# Patient Record
Sex: Female | Born: 1955 | Race: White | Hispanic: No | Marital: Married | Smoking: Never smoker
Health system: Southern US, Community
[De-identification: ages and names within clinical notes are randomized; demographics above are authoritative.]

## PROBLEM LIST (undated history)

## (undated) HISTORY — PX: TONSILLECTOMY: SUR1361

---

## 2000-08-14 ENCOUNTER — Emergency Department (HOSPITAL_COMMUNITY): Admission: EM | Admit: 2000-08-14 | Discharge: 2000-08-14 | Payer: Self-pay | Admitting: Emergency Medicine

## 2000-08-14 ENCOUNTER — Encounter: Payer: Self-pay | Admitting: Emergency Medicine

## 2014-03-21 ENCOUNTER — Emergency Department (HOSPITAL_COMMUNITY)
Admission: EM | Admit: 2014-03-21 | Discharge: 2014-03-21 | Disposition: A | Discharge status: Home / Self Care | Payer: Self-pay | Attending: Emergency Medicine | Admitting: Emergency Medicine

## 2014-03-21 ENCOUNTER — Encounter (HOSPITAL_COMMUNITY): Payer: Self-pay | Admitting: *Deleted

## 2014-03-21 ENCOUNTER — Emergency Department (HOSPITAL_COMMUNITY): Payer: Self-pay

## 2014-03-21 DIAGNOSIS — R630 Anorexia: Secondary | ICD-10-CM | POA: Insufficient documentation

## 2014-03-21 DIAGNOSIS — Z880 Allergy status to penicillin: Secondary | ICD-10-CM | POA: Insufficient documentation

## 2014-03-21 DIAGNOSIS — R1011 Right upper quadrant pain: Secondary | ICD-10-CM | POA: Insufficient documentation

## 2014-03-21 DIAGNOSIS — R109 Unspecified abdominal pain: Secondary | ICD-10-CM

## 2014-03-21 DIAGNOSIS — R1012 Left upper quadrant pain: Secondary | ICD-10-CM | POA: Insufficient documentation

## 2014-03-21 LAB — COMPREHENSIVE METABOLIC PANEL
ALT: 31 U/L (ref 0–35)
AST: 20 U/L (ref 0–37)
Albumin: 4.3 g/dL (ref 3.5–5.2)
Alkaline Phosphatase: 177 U/L — ABNORMAL HIGH (ref 39–117)
Anion gap: 17 — ABNORMAL HIGH (ref 5–15)
BUN: 12 mg/dL (ref 6–23)
CO2: 24 mEq/L (ref 19–32)
Calcium: 10.2 mg/dL (ref 8.4–10.5)
Chloride: 100 mEq/L (ref 96–112)
Creatinine, Ser: 0.8 mg/dL (ref 0.50–1.10)
GFR calc Af Amer: >90 mL/min (ref >90)
GFR calc non Af Amer: 80 mL/min — ABNORMAL LOW (ref >90)
Glucose, Bld: 102 mg/dL — ABNORMAL HIGH (ref 70–99)
Potassium: 3.6 mEq/L — ABNORMAL LOW (ref 3.7–5.3)
Sodium: 141 mEq/L (ref 137–147)
Total Bilirubin: 0.3 mg/dL (ref 0.3–1.2)
Total Protein: 7.5 g/dL (ref 6.0–8.3)

## 2014-03-21 LAB — CBC WITH DIFFERENTIAL/PLATELET
Basophils Absolute: 0 10*3/uL (ref 0.0–0.1)
Basophils Relative: 0 % (ref 0–1)
Eosinophils Absolute: 0 10*3/uL (ref 0.0–0.7)
Eosinophils Relative: 0 % (ref 0–5)
HCT: 46.3 % — ABNORMAL HIGH (ref 36.0–46.0)
Hemoglobin: 15.6 g/dL — ABNORMAL HIGH (ref 12.0–15.0)
Lymphocytes Relative: 26 % (ref 12–46)
Lymphs Abs: 2 10*3/uL (ref 0.7–4.0)
MCH: 30.1 pg (ref 26.0–34.0)
MCHC: 33.7 g/dL (ref 30.0–36.0)
MCV: 89.4 fL (ref 78.0–100.0)
Monocytes Absolute: 0.6 10*3/uL (ref 0.1–1.0)
Monocytes Relative: 7 % (ref 3–12)
Neutro Abs: 5.2 10*3/uL (ref 1.7–7.7)
Neutrophils Relative %: 67 % (ref 43–77)
Platelets: 255 10*3/uL (ref 150–400)
RBC: 5.18 MIL/uL — ABNORMAL HIGH (ref 3.87–5.11)
RDW: 13.7 % (ref 11.5–15.5)
WBC: 7.8 10*3/uL (ref 4.0–10.5)

## 2014-03-21 LAB — URINE MICROSCOPIC-ADD ON

## 2014-03-21 LAB — URINALYSIS, ROUTINE W REFLEX MICROSCOPIC
BILIRUBIN URINE: NEGATIVE
Glucose, UA: NEGATIVE mg/dL
Ketones, ur: NEGATIVE mg/dL
Leukocytes, UA: NEGATIVE
Nitrite: NEGATIVE
PROTEIN: NEGATIVE mg/dL
Specific Gravity, Urine: 1.016 (ref 1.005–1.030)
Urobilinogen, UA: 0.2 mg/dL (ref 0.0–1.0)
pH: 6 (ref 5.0–8.0)

## 2014-03-21 LAB — LIPASE, BLOOD: Lipase: 28 U/L (ref 11–59)

## 2014-03-21 MED ORDER — IOHEXOL 300 MG/ML  SOLN
50.0000 mL | Freq: Once | INTRAMUSCULAR | Status: AC | PRN
  Administered 2014-03-21: 50 mL via ORAL

## 2014-03-21 MED ORDER — HYDROCODONE-ACETAMINOPHEN 5-325 MG PO TABS: 1.0000 | ORAL_TABLET | Freq: Four times a day (QID) | ORAL | Status: DC | PRN

## 2014-03-21 MED ORDER — IOHEXOL 300 MG/ML  SOLN
100.0000 mL | Freq: Once | INTRAMUSCULAR | Status: AC | PRN
  Administered 2014-03-21: 100 mL via INTRAVENOUS

## 2014-03-21 NOTE — Discharge Instructions (Signed)
Abdominal Pain, Women °Abdominal (stomach, pelvic, or belly) pain can be caused by many things. It is important to tell your doctor: °· The location of the pain. °· Does it come and go or is it present all the time? °· Are there things that start the pain (eating certain foods, exercise)? °· Are there other symptoms associated with the pain (fever, nausea, vomiting, diarrhea)? °All of this is helpful to know when trying to find the cause of the pain. °CAUSES  °· Stomach: virus or bacteria infection, or ulcer. °· Intestine: appendicitis (inflamed appendix), regional ileitis (Crohn's disease), ulcerative colitis (inflamed colon), irritable bowel syndrome, diverticulitis (inflamed diverticulum of the colon), or cancer of the stomach or intestine. °· Gallbladder disease or stones in the gallbladder. °· Kidney disease, kidney stones, or infection. °· Pancreas infection or cancer. °· Fibromyalgia (pain disorder). °· Diseases of the female organs: °¨ Uterus: fibroid (non-cancerous) tumors or infection. °¨ Fallopian tubes: infection or tubal pregnancy. °¨ Ovary: cysts or tumors. °¨ Pelvic adhesions (scar tissue). °¨ Endometriosis (uterus lining tissue growing in the pelvis and on the pelvic organs). °¨ Pelvic congestion syndrome (female organs filling up with blood just before the menstrual period). °¨ Pain with the menstrual period. °¨ Pain with ovulation (producing an egg). °¨ Pain with an IUD (intrauterine device, birth control) in the uterus. °¨ Cancer of the female organs. °· Functional pain (pain not caused by a disease, may improve without treatment). °· Psychological pain. °· Depression. °DIAGNOSIS  °Your doctor will decide the seriousness of your pain by doing an examination. °· Blood tests. °· X-rays. °· Ultrasound. °· CT scan (computed tomography, special type of X-ray). °· MRI (magnetic resonance imaging). °· Cultures, for infection. °· Barium enema (dye inserted in the large intestine, to better view it with  X-rays). °· Colonoscopy (looking in intestine with a lighted tube). °· Laparoscopy (minor surgery, looking in abdomen with a lighted tube). °· Major abdominal exploratory surgery (looking in abdomen with a large incision). °TREATMENT  °The treatment will depend on the cause of the pain.  °· Many cases can be observed and treated at home. °· Over-the-counter medicines recommended by your caregiver. °· Prescription medicine. °· Antibiotics, for infection. °· Birth control pills, for painful periods or for ovulation pain. °· Hormone treatment, for endometriosis. °· Nerve blocking injections. °· Physical therapy. °· Antidepressants. °· Counseling with a psychologist or psychiatrist. °· Minor or major surgery. °HOME CARE INSTRUCTIONS  °· Do not take laxatives, unless directed by your caregiver. °· Take over-the-counter pain medicine only if ordered by your caregiver. Do not take aspirin because it can cause an upset stomach or bleeding. °· Try a clear liquid diet (broth or water) as ordered by your caregiver. Slowly move to a bland diet, as tolerated, if the pain is related to the stomach or intestine. °· Have a thermometer and take your temperature several times a day, and record it. °· Bed rest and sleep, if it helps the pain. °· Avoid sexual intercourse, if it causes pain. °· Avoid stressful situations. °· Keep your follow-up appointments and tests, as your caregiver orders. °· If the pain does not go away with medicine or surgery, you may try: °¨ Acupuncture. °¨ Relaxation exercises (yoga, meditation). °¨ Group therapy. °¨ Counseling. °SEEK MEDICAL CARE IF:  °· You notice certain foods cause stomach pain. °· Your home care treatment is not helping your pain. °· You need stronger pain medicine. °· You want your IUD removed. °· You feel faint or   lightheaded. °· You develop nausea and vomiting. °· You develop a rash. °· You are having side effects or an allergy to your medicine. °SEEK IMMEDIATE MEDICAL CARE IF:  °· Your  pain does not go away or gets worse. °· You have a fever. °· Your pain is felt only in portions of the abdomen. The right side could possibly be appendicitis. The left lower portion of the abdomen could be colitis or diverticulitis. °· You are passing blood in your stools (bright red or black tarry stools, with or without vomiting). °· You have blood in your urine. °· You develop chills, with or without a fever. °· You pass out. °MAKE SURE YOU:  °· Understand these instructions. °· Will watch your condition. °· Will get help right away if you are not doing well or get worse. °Document Released: 01/13/2007 Document Revised: 08/02/2013 Document Reviewed: 02/02/2009 °[employer]® Patient Information ©2015 [employer]. This information is not intended to replace advice given to you by your health care provider. Make sure you discuss any questions you have with your health care provider. ° °

## 2014-03-21 NOTE — ED Provider Notes (Signed)
CSN: [id number]     Arrival date & time 03/21/14  1041 History   First MD Initiated Contact with Patient 03/21/14 1213     Chief Complaint  Patient presents with  . Abdominal Pain     (Consider location/radiation/quality/duration/timing/severity/associated sxs/prior Treatment) Patient is a 58 y.o. female presenting with abdominal pain.  Abdominal Pain Pain location:  Epigastric, LUQ and RUQ Pain quality: aching   Pain radiates to:  Does not radiate Pain severity:  Moderate Onset quality:  Gradual Duration:  16 weeks Timing:  Constant Progression:  Waxing and waning Context: recent illness (recent PNA)   Context: not previous surgeries   Relieved by:  Nothing Worsened by:  Movement Associated symptoms: anorexia   Associated symptoms: no constipation, no cough, no diarrhea, no dysuria, no fever, no nausea and no vomiting     History reviewed. No pertinent past medical history. Past Surgical History  Procedure Laterality Date  . Tonsillectomy     History reviewed. No pertinent family history. History  Substance Use Topics  . Smoking status: Never Smoker   . Smokeless tobacco: Not on file  . Alcohol Use: No   OB History    No data available     Review of Systems  Constitutional: Negative for fever.  Respiratory: Negative for cough.   Gastrointestinal: Positive for abdominal pain and anorexia. Negative for nausea, vomiting, diarrhea and constipation.  Genitourinary: Negative for dysuria.  All other systems reviewed and are negative.     Allergies  Penicillins  Home Medications   Prior to Admission medications   Medication Sig Start Date End Date Taking? Authorizing Provider  Ranitidine HCl (ZANTAC PO) Take 1 tablet by mouth daily as needed (heartburn).   Yes Historical Provider, MD  HYDROcodone-acetaminophen (NORCO/VICODIN) 5-325 MG per tablet Take 1 tablet by mouth every 6 (six) hours as needed for moderate pain. 03/21/14   Elwin MochaBlair Walden, MD   BP 142/99  mmHg  Pulse 89  Temp(Src) 97.6 F (36.4 C) (Oral)  Resp 16  SpO2 96% Physical Exam  Constitutional: She is oriented to person, place, and time. She appears well-developed and well-nourished.  HENT:  Head: Normocephalic and atraumatic.  Right Ear: External ear normal.  Left Ear: External ear normal.  Eyes: Conjunctivae and EOM are normal. Pupils are equal, round, and reactive to light.  Neck: Normal range of motion. Neck supple.  Cardiovascular: Normal rate, regular rhythm, normal heart sounds and intact distal pulses.   Pulmonary/Chest: Effort normal and breath sounds normal.  Abdominal: Soft. Bowel sounds are normal. There is tenderness in the right upper quadrant, epigastric area and left upper quadrant. There is positive Murphy's sign.  Musculoskeletal: Normal range of motion.  Neurological: She is alert and oriented to person, place, and time.  Skin: Skin is warm and dry.  Vitals reviewed.   ED Course  Procedures (including critical care time) Labs Review Labs Reviewed  COMPREHENSIVE METABOLIC PANEL - Abnormal; Notable for the following:    Potassium 3.6 (*)    Glucose, Bld 102 (*)    Alkaline Phosphatase 177 (*)    GFR calc non Af Amer 80 (*)    Anion gap 17 (*)    All other components within normal limits  CBC WITH DIFFERENTIAL - Abnormal; Notable for the following:    RBC 5.18 (*)    Hemoglobin 15.6 (*)    HCT 46.3 (*)    All other components within normal limits  URINALYSIS, ROUTINE W REFLEX MICROSCOPIC - Abnormal; Notable for  the following:    Hgb urine dipstick TRACE (*)    All other components within normal limits  LIPASE, BLOOD  URINE MICROSCOPIC-ADD ON    Imaging Review Ct Abdomen Pelvis W Contrast  03/21/2014   CLINICAL DATA:  Epigastric pain, RIGHT to LEFT mid abdominal pain off and on for 4 months  EXAM: CT ABDOMEN AND PELVIS WITH CONTRAST  TECHNIQUE: Multidetector CT imaging of the abdomen and pelvis was performed using the standard protocol following  bolus administration of intravenous contrast. Sagittal and coronal MPR images reconstructed from axial data set.  CONTRAST:  100mL OMNIPAQUE IOHEXOL 300 MG/ML SOLN IV. Dilute oral contrast.  COMPARISON:  None ; prior exam 08/14/2000 predates PACs and is no longer available for comparison.  FINDINGS: Linear subsegmental atelectasis BILATERAL lower lobes.  Liver, spleen, pancreas, kidneys, and adrenal glands normal.  Tiny umbilical hernia containing fat.  Normal appendix.  Cecum unopacified and incompletely distended, suboptimally assessed.  Minimally thickened pylorus without outlet obstruction.  Stomach and remaining bowel loops unremarkable.  Normal appearing bladder, ureters, uterus and adnexae.  No mass, adenopathy, free fluid or free air.  No acute osseous findings.  IMPRESSION: No acute intra-abdominal or intrapelvic abnormalities identified.  Tiny umbilical hernia containing fat.   Electronically Signed   By: Ulyses SouthwardMark  Boles M.D.   On: 03/21/2014 16:45   [place] Abdomen Limited  03/21/2014   CLINICAL DATA:  Four month history of right upper quadrant pain  EXAM: US ABDOMEN LIMITED - RIGHT UPPER QUADRANT  COMPARISON:  None.  FINDINGS: Gallbladder:  No gallstones or wall thickening visualized. There is no pericholecystic fluid. No sonographic Murphy sign noted.  Common bile duct:  Diameter: 4 mm. There is no intrahepatic or extrahepatic biliary duct dilatation.  Liver:  No focal lesion identified. The liver echogenicity is diffusely increased.  IMPRESSION: Diffuse increase in liver echogenicity. This finding is felt to be secondary to hepatic steatosis. While no focal liver lesions are identified, it must be cautioned that the sensitivity of ultrasound for focal liver lesions is diminished in this circumstance. Study otherwise unremarkable.   Electronically Signed   By: Bretta BangWilliam  Woodruff M.D.   On: 03/21/2014 14:22     EKG Interpretation None      MDM   Final diagnoses:  RUQ pain  Abdominal pain    58  y.o. female without pertinent PMH presents with abdominal pain as described above. Pain ongoing for 4 months, patient states her symptoms have possibly worsened over the past few days, however cannot give a specific reason or precipitant for her visit today. She denies GI symptoms.  She has tried drinking lemon juice, Castor oil, other home remedies without relief. She states that her pain is not consistently exacerbated by eating. On arrival today vitals signs and physical exam as above. Patient has positive Murphy sign.    US remarkable for hepatic steatosis, no cholecystitis.  CT scan ordered.  Pt care to Dr. Gwendolyn GrantWalden pending CT.  I have reviewed all laboratory and imaging studies if ordered as above  1. RUQ pain   2. Abdominal pain         Mirian MoMatthew Jodi Kappes, MD 03/22/14 [phone]

## 2014-03-21 NOTE — ED Provider Notes (Signed)
1630 - Care from Dr. Littie DeedsGentry. Several months of abdominal pain. Awaiting CT scan. CT negative for acute process. Explained she needs f/u with her PCP. She refused Rx for narcotics.   1. RUQ pain   2. Abdominal pain      Elwin MochaBlair Kyson Kupper, MD 03/21/14 1755

## 2014-03-21 NOTE — ED Notes (Signed)
Pt reports intermittent abdominal pain x4 months. Pt reports this episode got worse over the last few days. Abdominal discomfort 7/10. Denies n/v/d. Last bowel movement today. Denies blood in urine or stool.

## 2015-05-10 IMAGING — CT CT ABD-PELV W/ CM
1 of 3 series · 14 of 32 positions shown, 19 images · IV contrast (OMNIPAQUE 300)
Comparison: None ; prior exam 08/14/2000 predates PACs and is no
longer available for comparison.

CLINICAL DATA: Epigastric pain, RIGHT to LEFT mid abdominal pain
off and on for 4 months

EXAM:
CT ABDOMEN AND PELVIS WITH CONTRAST
TECHNIQUE: Multidetector CT imaging of the abdomen and pelvis was performed
using the standard protocol following bolus administration of
intravenous contrast. Sagittal and coronal MPR images reconstructed
from axial data set.
CONTRAST:  100mL OMNIPAQUE IOHEXOL 300 MG/ML SOLN IV. Dilute oral
contrast.

[Series 2: abd/pel with · axial · 0.85mm/px · z∈[+1216,+1646]mm · 14 of 98 slices shown, 19 images]
[im 6/98  soft-tissue]
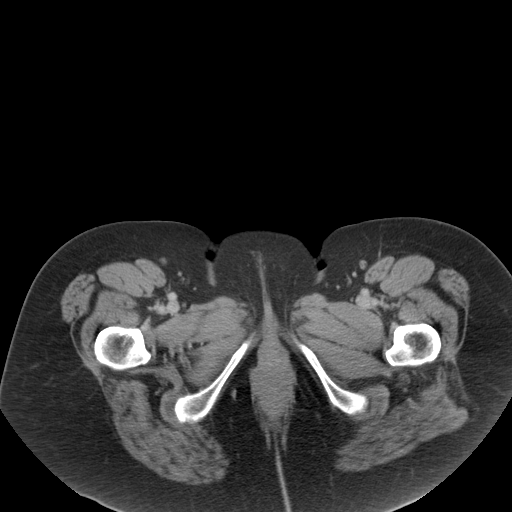
[im 6/98  bone]
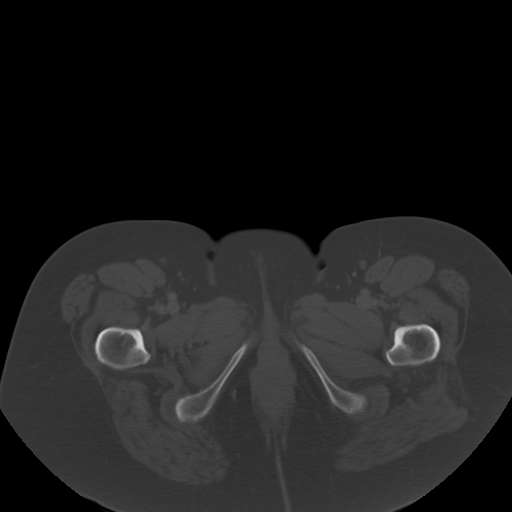
[im 11/98  soft-tissue]
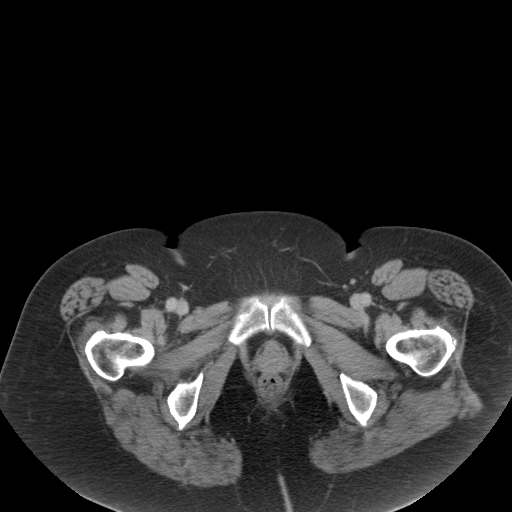
[im 22/98  soft-tissue]
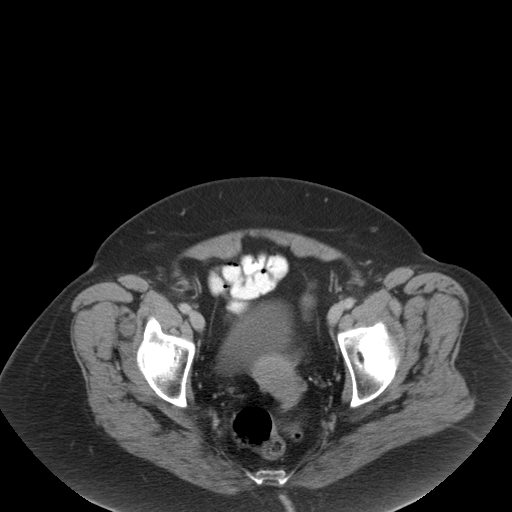
[im 27/98  soft-tissue]
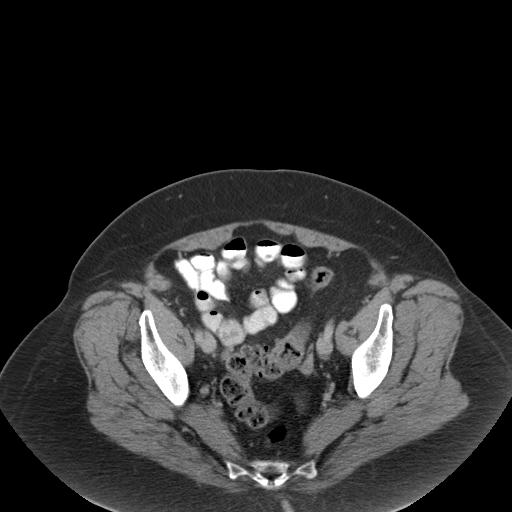
[im 33/98  soft-tissue]
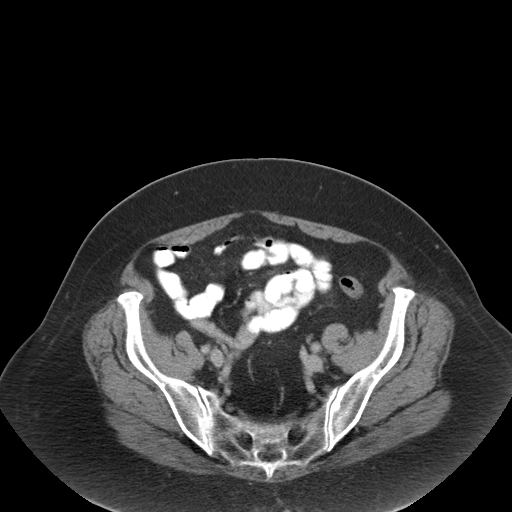
[im 44/98  soft-tissue]
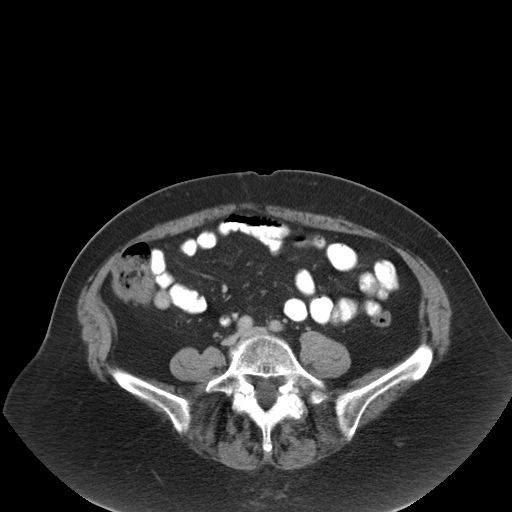
[im 49/98  soft-tissue]
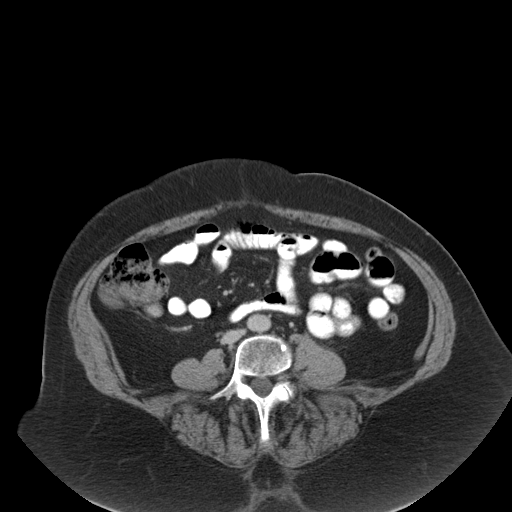
[im 54/98  soft-tissue]
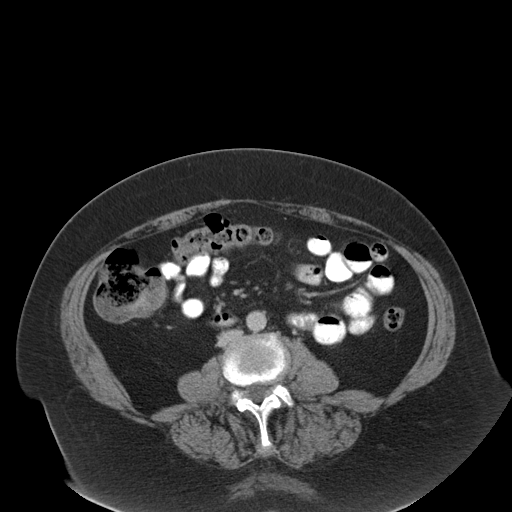
[im 65/98  soft-tissue]
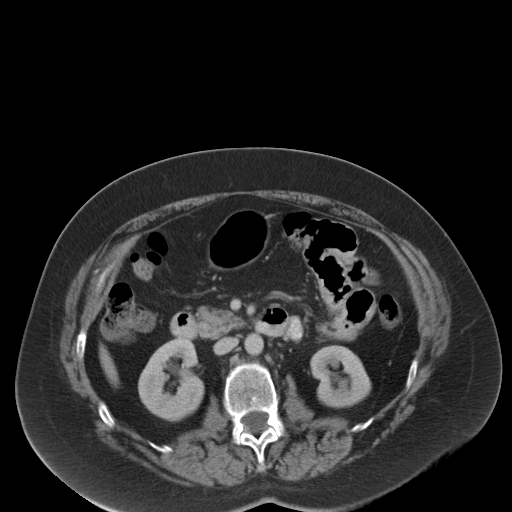
[im 65/98  bone]
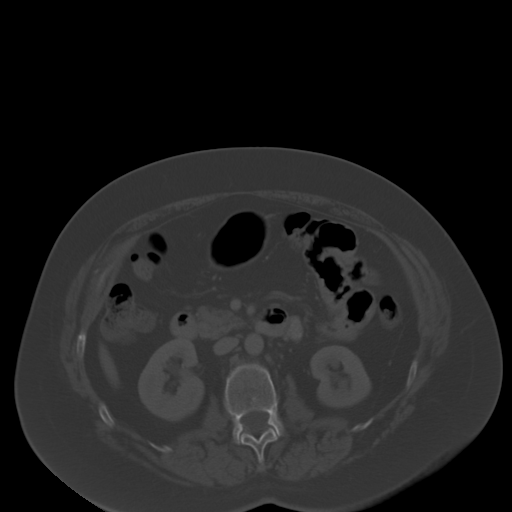
[im 71/98  soft-tissue]
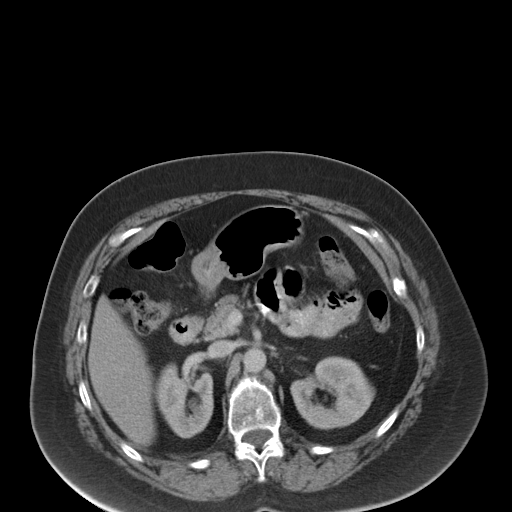
[im 76/98  soft-tissue]
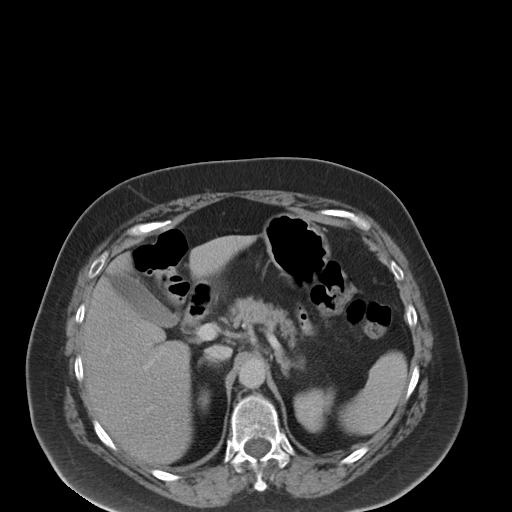
[im 76/98  lung]
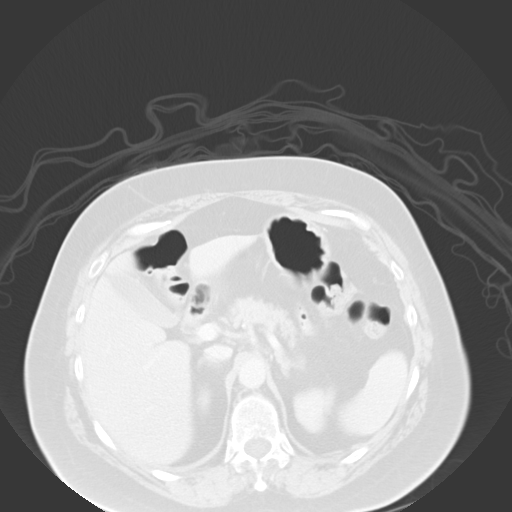
[im 81/98  lung]
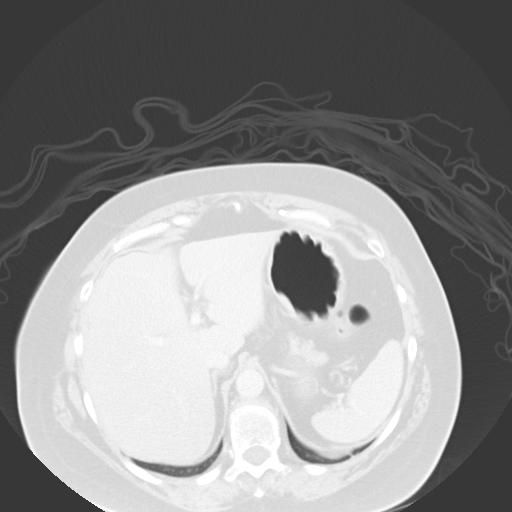
[im 87/98  soft-tissue]
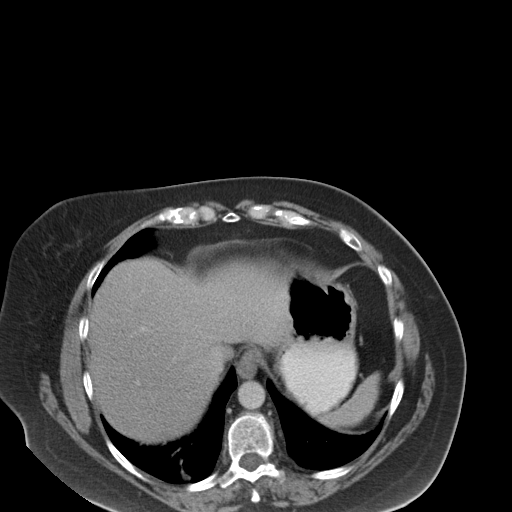
[im 87/98  lung]
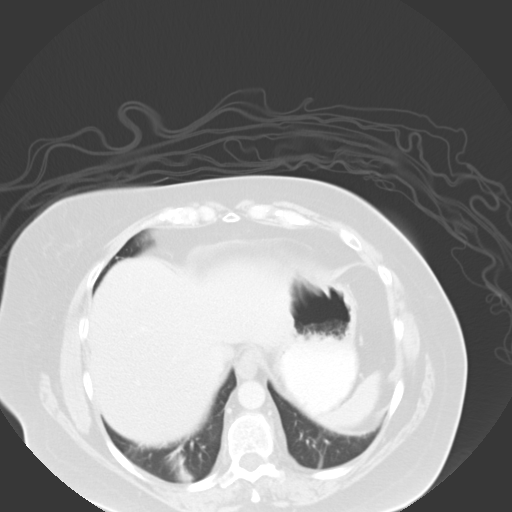
[im 92/98  soft-tissue]
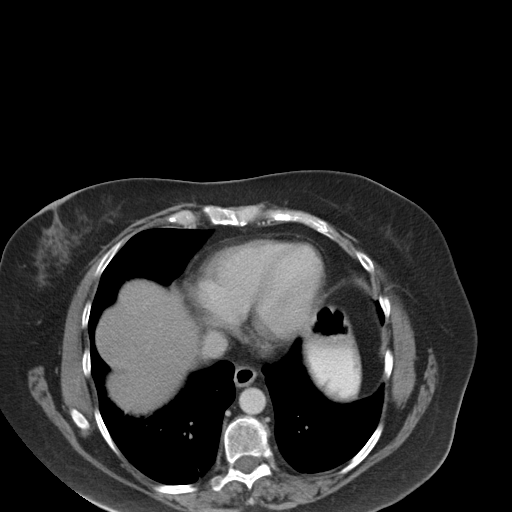
[im 92/98  lung]
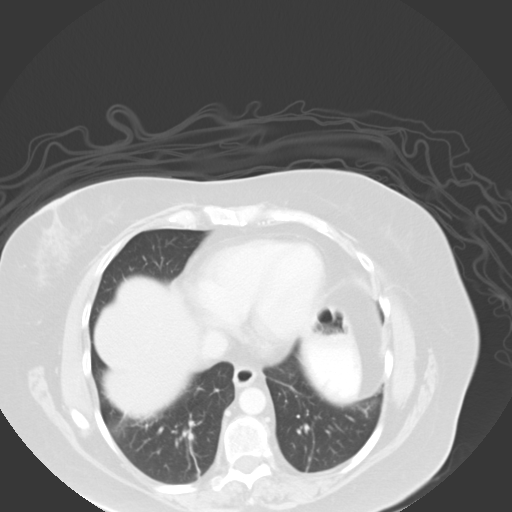

[14 of 32 positions shown; findings below may reference images not displayed]

FINDINGS: Linear subsegmental atelectasis BILATERAL lower lobes.

Liver, spleen, pancreas, kidneys, and adrenal glands normal.

Tiny umbilical hernia containing fat.

Normal appendix.

Cecum unopacified and incompletely distended, suboptimally assessed.

Minimally thickened pylorus without outlet obstruction.

Stomach and remaining bowel loops unremarkable.

Normal appearing bladder, ureters, uterus and adnexae.

No mass, adenopathy, free fluid or free air.

No acute osseous findings.
IMPRESSION: No acute intra-abdominal or intrapelvic abnormalities identified.

Tiny umbilical hernia containing fat.

## 2022-05-15 ENCOUNTER — Other Ambulatory Visit: Payer: Self-pay | Admitting: Nurse Practitioner

## 2022-07-16 ENCOUNTER — Ambulatory Visit (INDEPENDENT_AMBULATORY_CARE_PROVIDER_SITE_OTHER): Payer: Medicare Other | Admitting: Family Medicine

## 2022-07-16 ENCOUNTER — Encounter: Payer: Self-pay | Admitting: Family Medicine

## 2022-07-16 VITALS — BP 146/86 | HR 64 | Resp 18 | Ht 66.14 in | Wt 247.0 lb

## 2022-07-16 DIAGNOSIS — R635 Abnormal weight gain: Secondary | ICD-10-CM

## 2022-07-16 DIAGNOSIS — R7309 Other abnormal glucose: Secondary | ICD-10-CM | POA: Diagnosis not present

## 2022-07-16 DIAGNOSIS — Z833 Family history of diabetes mellitus: Secondary | ICD-10-CM

## 2022-07-16 DIAGNOSIS — Z130 Encounter for screening for diseases of the blood and blood-forming organs and certain disorders involving the immune mechanism: Secondary | ICD-10-CM

## 2022-07-16 DIAGNOSIS — I10 Essential (primary) hypertension: Secondary | ICD-10-CM

## 2022-07-16 DIAGNOSIS — Z7689 Persons encountering health services in other specified circumstances: Secondary | ICD-10-CM

## 2022-07-16 DIAGNOSIS — F411 Generalized anxiety disorder: Secondary | ICD-10-CM | POA: Diagnosis not present

## 2022-07-16 MED ORDER — LORAZEPAM 1 MG PO TABS: ORAL_TABLET | ORAL | 1 refills | Status: DC

## 2022-07-16 MED ORDER — METOPROLOL SUCCINATE ER 50 MG PO TB24: 50.0000 mg | ORAL_TABLET | Freq: Two times a day (BID) | ORAL | 2 refills | Status: DC

## 2022-07-16 MED ORDER — ESCITALOPRAM OXALATE 10 MG PO TABS: 10.0000 mg | ORAL_TABLET | Freq: Every day | ORAL | 2 refills | Status: DC

## 2022-07-16 NOTE — Assessment & Plan Note (Signed)
Blood pressure in the office today above goal of less than 140/90.  We discussed that we may need to change her medication regimen if not consistently at goal.  Encouraged her to keep a blood pressure log.  At her follow-up, if blood pressure still elevated will add on valsartan 80 mg daily.

## 2022-07-16 NOTE — Assessment & Plan Note (Signed)
We discussed the mechanism of action and indications for medications like lorazepam versus SSRIs and SNRIs.  Benzodiazepines including lorazepam are indicated for breakthrough anxiety and less effective for maintenance medication.  Patient verbalized understanding and is agreeable to try something to have a more even tempered baseline as she states that it is difficult for her to deal with stress all the time.  Starting Lexapro 10 mg daily and will follow-up in 6 weeks to assess how this is going.  Also refill lorazepam as bridge therapy with the discussion that once Lexapro reaches its therapeutic levels, the lorazepam should be as rescue medication.  Patient verbalized understanding of this and will start taking Lexapro soon as the prescription is filled.
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
## 2022-07-17 ENCOUNTER — Other Ambulatory Visit: Payer: Self-pay | Admitting: Family Medicine

## 2022-07-17 DIAGNOSIS — R946 Abnormal results of thyroid function studies: Secondary | ICD-10-CM

## 2022-07-17 DIAGNOSIS — E559 Vitamin D deficiency, unspecified: Secondary | ICD-10-CM | POA: Insufficient documentation

## 2022-07-17 DIAGNOSIS — R7989 Other specified abnormal findings of blood chemistry: Secondary | ICD-10-CM

## 2022-07-17 LAB — COMPREHENSIVE METABOLIC PANEL
ALT: 22 IU/L (ref 0–32)
AST: 20 IU/L (ref 0–40)
Albumin/Globulin Ratio: 1.7 (ref 1.2–2.2)
Albumin: 4.5 g/dL (ref 3.9–4.9)
Alkaline Phosphatase: 167 IU/L — ABNORMAL HIGH (ref 44–121)
BUN/Creatinine Ratio: 15 (ref 12–28)
BUN: 13 mg/dL (ref 8–27)
Bilirubin Total: 0.4 mg/dL (ref 0.0–1.2)
CO2: 22 mmol/L (ref 20–29)
Calcium: 9.6 mg/dL (ref 8.7–10.3)
Chloride: 103 mmol/L (ref 96–106)
Creatinine, Ser: 0.86 mg/dL (ref 0.57–1.00)
Globulin, Total: 2.6 g/dL (ref 1.5–4.5)
Glucose: 87 mg/dL (ref 70–99)
Potassium: 4 mmol/L (ref 3.5–5.2)
Sodium: 140 mmol/L (ref 134–144)
Total Protein: 7.1 g/dL (ref 6.0–8.5)
eGFR: 74 mL/min/{1.73_m2} (ref >59)

## 2022-07-17 LAB — CBC WITH DIFFERENTIAL/PLATELET
Basophils Absolute: 0 10*3/uL (ref 0.0–0.2)
Basos: 1 %
EOS (ABSOLUTE): 0.1 10*3/uL (ref 0.0–0.4)
Eos: 1 %
Hematocrit: 45.1 % (ref 34.0–46.6)
Hemoglobin: 14.9 g/dL (ref 11.1–15.9)
Immature Grans (Abs): 0 10*3/uL (ref 0.0–0.1)
Immature Granulocytes: 0 %
Lymphocytes Absolute: 1.8 10*3/uL (ref 0.7–3.1)
Lymphs: 25 %
MCH: 29.4 pg (ref 26.6–33.0)
MCHC: 33 g/dL (ref 31.5–35.7)
MCV: 89 fL (ref 79–97)
Monocytes Absolute: 0.6 10*3/uL (ref 0.1–0.9)
Monocytes: 9 %
Neutrophils Absolute: 4.7 10*3/uL (ref 1.4–7.0)
Neutrophils: 64 %
Platelets: 233 10*3/uL (ref 150–450)
RBC: 5.07 x10E6/uL (ref 3.77–5.28)
RDW: 13.5 % (ref 11.7–15.4)
WBC: 7.2 10*3/uL (ref 3.4–10.8)

## 2022-07-17 LAB — HEMOGLOBIN A1C
Est. average glucose Bld gHb Est-mCnc: 126 mg/dL
Hgb A1c MFr Bld: 6 % — ABNORMAL HIGH (ref 4.8–5.6)

## 2022-07-17 LAB — LIPID PANEL
Chol/HDL Ratio: 2.5 ratio (ref 0.0–4.4)
Cholesterol, Total: 158 mg/dL (ref 100–199)
HDL: 63 mg/dL (ref >39)
LDL Chol Calc (NIH): 77 mg/dL (ref 0–99)
Triglycerides: 99 mg/dL (ref 0–149)
VLDL Cholesterol Cal: 18 mg/dL (ref 5–40)

## 2022-07-17 LAB — VITAMIN D 25 HYDROXY (VIT D DEFICIENCY, FRACTURES): Vit D, 25-Hydroxy: 25.4 ng/mL — ABNORMAL LOW (ref 30.0–100.0)

## 2022-07-17 LAB — TSH: TSH: 6.95 u[IU]/mL — ABNORMAL HIGH (ref 0.450–4.500)

## 2022-07-17 MED ORDER — VITAMIN D (ERGOCALCIFEROL) 1.25 MG (50000 UNIT) PO CAPS: 50000.0000 [IU] | ORAL_CAPSULE | ORAL | 0 refills | Status: DC

## 2022-07-22 ENCOUNTER — Other Ambulatory Visit: Payer: Medicare Other

## 2022-07-22 DIAGNOSIS — R946 Abnormal results of thyroid function studies: Secondary | ICD-10-CM

## 2022-07-22 DIAGNOSIS — R7989 Other specified abnormal findings of blood chemistry: Secondary | ICD-10-CM

## 2022-07-23 LAB — T4, FREE: Free T4: 0.84 ng/dL (ref 0.82–1.77)

## 2022-08-28 ENCOUNTER — Ambulatory Visit (INDEPENDENT_AMBULATORY_CARE_PROVIDER_SITE_OTHER): Payer: Medicare Other | Admitting: Family Medicine

## 2022-08-28 ENCOUNTER — Encounter: Payer: Self-pay | Admitting: Family Medicine

## 2022-08-28 VITALS — BP 159/72 | HR 67 | Resp 18 | Ht 66.14 in | Wt 253.0 lb

## 2022-08-28 DIAGNOSIS — I10 Essential (primary) hypertension: Secondary | ICD-10-CM

## 2022-08-28 DIAGNOSIS — F411 Generalized anxiety disorder: Secondary | ICD-10-CM | POA: Diagnosis not present

## 2022-08-28 MED ORDER — SERTRALINE HCL 25 MG PO TABS: 25.0000 mg | ORAL_TABLET | Freq: Every day | ORAL | 1 refills | Status: DC

## 2022-08-28 MED ORDER — LORAZEPAM 1 MG PO TABS: ORAL_TABLET | ORAL | 0 refills | Status: DC

## 2022-08-28 NOTE — Assessment & Plan Note (Signed)
Blood pressure in the office today above goal of less than 140/90.  We will try to get anxiety under better control to see how much that is contributing to her high blood pressure readings.  If anxiety becomes well-controlled but blood pressure still above goal, will add on valsartan 80 mg daily.  Will continue to monitor.

## 2022-08-28 NOTE — Assessment & Plan Note (Signed)
We discussed at length the mechanism of action and indications for lorazepam/benzodiazepines, specifically that they are indicated for breakthrough anxiety and not for maintenance medication.  I answered her questions regarding benzodiazepines and the difference between that group and SSRIs/SNRIs.  We further discussed that anxiety medication can often be a matter of finding a good fit, which can take some time.  I offered her the options of either continuing with a trial/error approach at primary care versus referral to psychiatry, she opted to remain with PCP.  We discussed options of SSRIs versus SNRIs, patient would prefer to try another SSRI first.  Discontinuing Lexapro, start Zoloft 25 mg daily.  Will follow-up in 1 month.  Also providing refill of lorazepam as bridge therapy.  Patient verbalized understanding and is agreeable with this plan.

## 2022-08-28 NOTE — Progress Notes (Signed)
Established Patient Office Visit  Subjective   Patient ID: Lindsey Valentine, female    DOB: 1955/09/27  Age: 67 y.o. MRN: [id number]  Chief Complaint  Patient presents with   Anxiety   Hypertension    HPI Lindsey Valentine is a 67 y.o. female presenting today for follow up of hypertension, mood. Hypertension: Patient here for follow-up of elevated blood pressure.  Pt denies chest pain, SOB, dizziness, edema, syncope, fatigue or heart palpitations. Taking metoprolol, reports excellent compliance with treatment. Denies side effects. Mood: Patient is here to follow up for anxiety, started Lexapro 10 mg daily at last appointment.  Took Lexapro for about 4 weeks before discontinuing due to headache, severe constipation, brain fog. Denies mood changes or SI/HI. She does not want to continue Lexapro.  She states that she is very sensitive to medications and it often takes a while to find one that works for her without side effects.  For this reason, she would like to continue taking lorazepam for her anxiety.  She states that she never takes more than 1 tablet in a day total, usually takes half a tablet 1-2 times each day.  Occasionally there will be a day where she does not need to take anything.  She has had anxiety from a very young age and knows that it is something that she needs to deal with.     08/28/2022   10:40 AM  Depression screen PHQ 2/9  Decreased Interest 0  Down, Depressed, Hopeless 0  PHQ - 2 Score 0  Altered sleeping 1  Tired, decreased energy 1  Change in appetite 0  Feeling bad or failure about yourself  0  Trouble concentrating 0  Moving slowly or fidgety/restless 0  Suicidal thoughts 0  PHQ-9 Score 2  Difficult doing work/chores Not difficult at all       08/28/2022   10:40 AM  GAD 7 : Generalized Anxiety Score  Nervous, Anxious, on Edge 1  Control/stop worrying 0  Worry too much - different things 0  Trouble relaxing 0  Restless 0  Easily annoyed or  irritable 1  Afraid - awful might happen 0  Total GAD 7 Score 2  Anxiety Difficulty Not difficult at all   ROS Negative unless otherwise noted in HPI   Objective:     BP (!) 159/72 (BP Location: Right Arm, Patient Position: Sitting, Cuff Size: Large)   Pulse 67   Resp 18   Ht 5' 6.14" (1.68 m)   Wt 253 lb (114.8 kg)   SpO2 96%   BMI 40.66 kg/m   Physical Exam Constitutional:      General: She is not in acute distress.    Appearance: Normal appearance.  HENT:     Head: Normocephalic and atraumatic.  Pulmonary:     Effort: Pulmonary effort is normal. No respiratory distress.  Musculoskeletal:     Cervical back: Normal range of motion.  Neurological:     General: No focal deficit present.     Mental Status: She is alert and oriented to person, place, and time. Mental status is at baseline.  Psychiatric:        Mood and Affect: Mood normal.        Thought Content: Thought content normal.        Judgment: Judgment normal.     Assessment & Plan:  Primary hypertension Assessment & Plan: Blood pressure in the office today above goal of less than 140/90.  We will  try to get anxiety under better control to see how much that is contributing to her high blood pressure readings.  If anxiety becomes well-controlled but blood pressure still above goal, will add on valsartan 80 mg daily.  Will continue to monitor.   GAD (generalized anxiety disorder) Assessment & Plan: We discussed at length the mechanism of action and indications for lorazepam/benzodiazepines, specifically that they are indicated for breakthrough anxiety and not for maintenance medication.  I answered her questions regarding benzodiazepines and the difference between that group and SSRIs/SNRIs.  We further discussed that anxiety medication can often be a matter of finding a good fit, which can take some time.  I offered her the options of either continuing with a trial/error approach at primary care versus referral to  psychiatry, she opted to remain with PCP.  We discussed options of SSRIs versus SNRIs, patient would prefer to try another SSRI first.  Discontinuing Lexapro, start Zoloft 25 mg daily.  Will follow-up in 1 month.  Also providing refill of lorazepam as bridge therapy.  Patient verbalized understanding and is agreeable with this plan.  Orders: -     Sertraline HCl; Take 1 tablet (25 mg total) by mouth daily.  Dispense: 30 tablet; Refill: 1 -     LORazepam; Take 1 tablet (1 mg total) by mouth 2 (two) times daily as needed for breakthrough severe anxiety.  Dispense: 30 tablet; Refill: 0    Return in about 4 weeks (around 09/25/2022) for follow-up for anxiety.   I spent 55 minutes on the day of the encounter to include pre-visit record review, face-to-face time with the patient answering questions, providing patient education and counseling, and post visit ordering of medications.  Melida Quitter, PA

## 2022-09-25 ENCOUNTER — Ambulatory Visit: Payer: Medicare Other | Admitting: Family Medicine

## 2022-10-16 ENCOUNTER — Ambulatory Visit: Payer: Medicare Other | Admitting: Family Medicine

## 2022-12-17 ENCOUNTER — Encounter: Payer: Self-pay | Admitting: Family Medicine

## 2022-12-17 ENCOUNTER — Ambulatory Visit (INDEPENDENT_AMBULATORY_CARE_PROVIDER_SITE_OTHER): Payer: Medicare Other | Admitting: Family Medicine

## 2022-12-17 VITALS — BP 146/87 | HR 62 | Resp 18 | Ht 66.14 in | Wt 249.0 lb

## 2022-12-17 DIAGNOSIS — F411 Generalized anxiety disorder: Secondary | ICD-10-CM | POA: Diagnosis not present

## 2022-12-17 DIAGNOSIS — I10 Essential (primary) hypertension: Secondary | ICD-10-CM

## 2022-12-17 MED ORDER — METOPROLOL SUCCINATE ER 50 MG PO TB24: 50.0000 mg | ORAL_TABLET | Freq: Two times a day (BID) | ORAL | 1 refills | Status: DC

## 2022-12-17 NOTE — Progress Notes (Signed)
Established Patient Office Visit  Subjective   Patient ID: Lindsey Valentine, female    DOB: 1956/02/13  Age: 67 y.o. MRN: [id number]  Chief Complaint  Patient presents with   Anxiety    HPI [name] is a 67 y.o. female presenting today for follow up of hypertension, anxiety. Hypertension: Patient here for follow-up of elevated blood pressure.  Pt denies chest pain, SOB, dizziness, edema, syncope, fatigue or heart palpitations. Taking metoprolol succinate, reports excellent compliance with treatment. Denies side effects.  She has not been checking her blood pressure at home.  Previously, we hoped that improved control of anxiety would also improve blood pressure control. Mood: Patient is here to follow up for anxiety, previously managing with Zoloft 25 mg daily but she has taken herself off of it.  She also has a prescription for lorazepam 1 mg tablets for breakthrough anxiety.  She has been managing with a more holistic approach and not using medication for the last several months.  She states it is going well.  Denies mood changes or SI/HI. She feels mood is improved since last visit.     12/17/2022    1:10 PM 08/28/2022   10:40 AM  Depression screen PHQ 2/9  Decreased Interest 0 0  Down, Depressed, Hopeless 0 0  PHQ - 2 Score 0 0  Altered sleeping 0 1  Tired, decreased energy 0 1  Change in appetite 0 0  Feeling bad or failure about yourself  0 0  Trouble concentrating 0 0  Moving slowly or fidgety/restless 0 0  Suicidal thoughts 0 0  PHQ-9 Score 0 2  Difficult doing work/chores Not difficult at all Not difficult at all       12/17/2022    1:11 PM 08/28/2022   10:40 AM  GAD 7 : Generalized Anxiety Score  Nervous, Anxious, on Edge 0 1  Control/stop worrying 0 0  Worry too much - different things 0 0  Trouble relaxing 0 0  Restless 0 0  Easily annoyed or irritable 0 1  Afraid - awful might happen 0 0  Total GAD 7 Score 0 2  Anxiety Difficulty Not difficult at  all Not difficult at all   Outpatient Medications Prior to Visit  Medication Sig   LORazepam (ATIVAN) 1 MG tablet Take 1 tablet (1 mg total) by mouth 2 (two) times daily as needed for breakthrough severe anxiety.   Ranitidine HCl (ZANTAC PO) Take 1 tablet by mouth daily as needed (heartburn).   Vitamin D, Ergocalciferol, (DRISDOL) 1.25 MG (50000 UNIT) CAPS capsule Take 1 capsule (50,000 Units total) by mouth every 7 (seven) days.   [DISCONTINUED] metoprolol succinate (TOPROL-XL) 50 MG 24 hr tablet Take 1 tablet (50 mg total) by mouth 2 (two) times daily.   [DISCONTINUED] sertraline (ZOLOFT) 25 MG tablet Take 1 tablet (25 mg total) by mouth daily.   No facility-administered medications prior to visit.    ROS Negative unless otherwise noted in HPI   Objective:     BP (!) 146/87 (BP Location: Right Arm, Patient Position: Sitting, Cuff Size: Large)   Pulse 62   Resp 18   Ht 5' 6.14" (1.68 m)   Wt 249 lb (112.9 kg)   SpO2 94%   BMI 40.02 kg/m   Physical Exam Constitutional:      General: She is not in acute distress.    Appearance: Normal appearance.  HENT:     Head: Normocephalic and atraumatic.  Cardiovascular:  Rate and Rhythm: Normal rate and regular rhythm.     Heart sounds: No murmur heard.    No friction rub. No gallop.  Pulmonary:     Effort: Pulmonary effort is normal. No respiratory distress.     Breath sounds: No wheezing, rhonchi or rales.  Skin:    General: Skin is warm and dry.  Neurological:     Mental Status: She is alert and oriented to person, place, and time.     Assessment & Plan:  Primary hypertension Assessment & Plan: BP goal <140/90.  Blood pressure above goal even with well-controlled anxiety.  Keep blood pressure log for the next 2 weeks and return for nurse visit.  If blood pressure remains elevated, start valsartan 80 mg daily and recheck CMP about 2 months after that.  Will continue to monitor.  Orders: -     Metoprolol Succinate ER;  Take 1 tablet (50 mg total) by mouth 2 (two) times daily.  Dispense: 180 tablet; Refill: 1  GAD (generalized anxiety disorder) Assessment & Plan: PHQ-9 score 0, GAD-7 score 0.  Patient managing well even without medication.  Will continue to monitor.   We discussed gaps in preventative care including PCV 20, shingles, Tdap vaccines as well as mammogram, colonoscopy, and DEXA scan.  Patient stated that she would consider each of these before our next appointment.  We discussed the importance of screenings and the purpose of each.  She does not have family history or personal history of colon cancer or colon polyps, so she is eligible for either colonoscopy or Cologuard.  She is also aware that she will need to go to her pharmacy to get any immunizations updated due to Medicare requirements.  Return in about 3 months (around 03/18/2023) for follow-up for HTN, mood; 2 week BP nurse visit.    Melida Quitter, PA

## 2022-12-17 NOTE — Assessment & Plan Note (Signed)
BP goal <140/90.  Blood pressure above goal even with well-controlled anxiety.  Keep blood pressure log for the next 2 weeks and return for nurse visit.  If blood pressure remains elevated, start valsartan 80 mg daily and recheck CMP about 2 months after that.  Will continue to monitor.

## 2022-12-17 NOTE — Patient Instructions (Signed)
I have provided additional information for you to read about the recommended vaccines and cancer screening procedures.  Please let me know if you have any questions, or feel free to send me a MyChart message if you decide to move forward with any of them.  Recommended vaccines: -Pneumonia PCV 20 -Shingles -Tetanus  Recommended screenings: -Mammogram for breast cancer screening -Colorectal cancer screening with either colonoscopy or Cologuard -Bone density test

## 2022-12-17 NOTE — Assessment & Plan Note (Signed)
PHQ-9 score 0, GAD-7 score 0.  Patient managing well even without medication.  Will continue to monitor.

## 2022-12-24 ENCOUNTER — Ambulatory Visit: Payer: Medicare Other

## 2022-12-25 ENCOUNTER — Encounter: Payer: Self-pay | Admitting: Family Medicine

## 2022-12-25 DIAGNOSIS — I10 Essential (primary) hypertension: Secondary | ICD-10-CM

## 2022-12-26 ENCOUNTER — Encounter: Payer: Self-pay | Admitting: Family Medicine

## 2022-12-26 DIAGNOSIS — I10 Essential (primary) hypertension: Secondary | ICD-10-CM | POA: Insufficient documentation

## 2022-12-26 NOTE — Progress Notes (Signed)
Ambulatory blood pressure readings at home on average 123/73 or less.

## 2022-12-31 ENCOUNTER — Ambulatory Visit: Payer: Medicare Other

## 2023-01-03 ENCOUNTER — Other Ambulatory Visit: Payer: Self-pay | Admitting: Family Medicine

## 2023-01-03 DIAGNOSIS — Z1211 Encounter for screening for malignant neoplasm of colon: Secondary | ICD-10-CM

## 2023-01-03 DIAGNOSIS — Z1212 Encounter for screening for malignant neoplasm of rectum: Secondary | ICD-10-CM

## 2023-03-18 ENCOUNTER — Ambulatory Visit: Payer: Medicare Other | Admitting: Family Medicine

## 2023-04-22 ENCOUNTER — Ambulatory Visit: Payer: Medicare Other | Admitting: Family Medicine

## 2023-05-08 ENCOUNTER — Encounter: Payer: Self-pay | Admitting: Family Medicine

## 2023-09-30 ENCOUNTER — Other Ambulatory Visit: Payer: Self-pay | Admitting: Family Medicine

## 2023-09-30 DIAGNOSIS — I10 Essential (primary) hypertension: Secondary | ICD-10-CM

## 2023-10-06 ENCOUNTER — Ambulatory Visit (INDEPENDENT_AMBULATORY_CARE_PROVIDER_SITE_OTHER)

## 2023-10-06 VITALS — BP 135/84 | HR 67 | Temp 97.8°F | Ht 66.0 in | Wt 259.1 lb

## 2023-10-06 DIAGNOSIS — I10 Essential (primary) hypertension: Secondary | ICD-10-CM

## 2023-10-06 DIAGNOSIS — F411 Generalized anxiety disorder: Secondary | ICD-10-CM | POA: Diagnosis not present

## 2023-10-06 MED ORDER — HYDROXYZINE HCL 10 MG PO TABS: 10.0000 mg | ORAL_TABLET | Freq: Three times a day (TID) | ORAL | 1 refills | Status: AC | PRN

## 2023-10-06 NOTE — Progress Notes (Signed)
 Established Patient Office Visit  Subjective   Patient ID: Lindsey Valentine, female    DOB: 05/07/1955  Age: 68 y.o. MRN: [id number]  Chief Complaint  Patient presents with   Medical Management of Chronic Issues    HPI  Lindsey Valentine is a 68 y.o. y/o female who presents to the clinic today for follow up on HTN, mood.   HTN: Patient currently taking Metoprolol  XL for control of their blood pressure. Reports excellence compliance with this medication. Denies side effects or episodes of hypotension. Checks BP at home periodically. Denies CP, SOB, Palpitations, vision changes, HA, or edema.  Mood:  Patient is here to follow up for anxiety, previously managing with Zoloft  25 mg daily >1 year ago, but she has taken herself off of it.  She also has a prescription for lorazepam  1 mg tablets for breakthrough anxiety.  She has been managing with a more holistic approach and not using medication for the last several months. Reports she is using more meditation and sound therapy to control her anxiety attacks.  She states it is going well.  Denies mood changes or SI/HI. She feels anxiety is stable, but not improved. Reports that she liked using the lorazepam  as needed, but does not understand why her previous provider did not want her to take this medication.     ROS Per HPI.    Objective:     BP 135/84 (BP Location: Left Arm, Patient Position: Sitting)   Pulse 67   Temp 97.8 F (36.6 C) (Oral)   Ht 5' 6 (1.676 m)   Wt 259 lb 1.3 oz (117.5 kg)   SpO2 94%   BMI 41.82 kg/m    Physical Exam Constitutional:      General: She is not in acute distress.    Appearance: Normal appearance.  Cardiovascular:     Rate and Rhythm: Normal rate and regular rhythm.     Heart sounds: Normal heart sounds. No murmur heard.    No friction rub. No gallop.  Pulmonary:     Effort: Pulmonary effort is normal. No respiratory distress.     Breath sounds: Normal breath sounds.  Musculoskeletal:         General: No swelling.  Skin:    General: Skin is warm and dry.  Neurological:     General: No focal deficit present.     Mental Status: She is alert.  Psychiatric:        Mood and Affect: Mood normal.        Behavior: Behavior normal.        Thought Content: Thought content normal.    No results found for any visits on 10/06/23.    The 10-year ASCVD risk score (Arnett DK, et al., 2019) is: 8.8%    Assessment & Plan:   GAD (generalized anxiety disorder) Assessment & Plan: PHQ-9 score = 0. GAD-7 score = 0. Patient managing her symptoms through meditation and sound therapy. Would like to have something as needed for when her anxiety gets uncontrollable through conservative measures. Previously managed with lorazepam  PRN, which she tolerated well. Taken off benzos by prior provider. Trialed sertraline  in the past but discontinued due to intolerance. Currently reports episodic panic symptoms 1-2 times per week, still functionally independent and active. No daily generalized anxiety symptoms, no depression, no cognitive concerns. Prefers non-daily medication and is not interested in psychotherapy at this time. Start Hydroxyzine  10 mg as needed for anxiety attacks. Advised patient that she can go  to 20 mg if needed. If still subtherapeutic at follow up, can consider higher dose of Hydroxyzine  vs 0.25 mg dose of Lorazepam .    Primary hypertension Assessment & Plan: BP Goal <140/90. Above goal in office today, at goal on recheck. Ambulatory blood pressure readings average 120/70's. Encouraged her to continue ambulatory BP monitoring and notify of consistent high/low readings. Continue Metoprolol  XL 50 mg daily.   Other orders -     hydrOXYzine  HCl; Take 1 tablet (10 mg total) by mouth 3 (three) times daily as needed.  Dispense: 30 tablet; Refill: 1    Return in about 3 months (around 01/06/2024) for Physical, HTN/anxiety follow up.    Lindsey JULIANNA Sacks, PA-C

## 2023-10-06 NOTE — Assessment & Plan Note (Signed)
 BP Goal <140/90. Above goal in office today, at goal on recheck. Ambulatory blood pressure readings average 120/70's. Encouraged her to continue ambulatory BP monitoring and notify of consistent high/low readings. Continue Metoprolol  XL 50 mg daily.

## 2023-10-06 NOTE — Assessment & Plan Note (Signed)
 PHQ-9 score = 0. GAD-7 score = 0. Patient managing her symptoms through meditation and sound therapy. Would like to have something as needed for when her anxiety gets uncontrollable through conservative measures. Previously managed with lorazepam  PRN, which she tolerated well. Taken off benzos by prior provider. Trialed sertraline  in the past but discontinued due to intolerance. Currently reports episodic panic symptoms 1-2 times per week, still functionally independent and active. No daily generalized anxiety symptoms, no depression, no cognitive concerns. Prefers non-daily medication and is not interested in psychotherapy at this time. Start Hydroxyzine  10 mg as needed for anxiety attacks. Advised patient that she can go to 20 mg if needed. If still subtherapeutic at follow up, can consider higher dose of Hydroxyzine  vs 0.25 mg dose of Lorazepam .

## 2023-10-06 NOTE — Patient Instructions (Addendum)
 It was nice to see you today!  As we discussed in clinic:  - Continue your metoprolol  daily for blood pressure.  Also continue checking your blood pressure periodically at home, if you receive extreme high/low readings, please let us  know. - I have also sent in hydroxyzine  10 mg for you to take up as needed for anxiety attacks.  This is the lowest dose, so we will try this first and can increase the dosage if needed.  This medication is considered safer for adults older than 65 and is not habit-forming as compared to Xanax/Ativan .  As we discussed, this medication can cause drowsiness, so I do recommend taking it first at nighttime to see how you tolerate it. -Lets plan to do your physical with blood work the week before sometime in October.  This will give us  a good time to check-in on anxiety as well.  I wish you safe travels to [place].  It was nice to meet you!    If you have any problems before your next visit feel free to message me via MyChart (minor issues or questions) or call the office, otherwise you may reach out to schedule an office visit.  Thank you! Saddie Sacks, PA-C

## 2023-12-30 ENCOUNTER — Other Ambulatory Visit

## 2024-01-06 ENCOUNTER — Ambulatory Visit
